# Patient Record
Sex: Male | Born: 1980 | Race: White | Hispanic: No | Marital: Single | Smoking: Current every day smoker
Health system: Southern US, Community
[De-identification: ages and names within clinical notes are randomized; demographics above are authoritative.]

## PROBLEM LIST (undated history)

## (undated) HISTORY — PX: HERNIA REPAIR: SHX51

---
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
## 1999-05-02 ENCOUNTER — Encounter: Admission: RE | Admit: 1999-05-02 | Discharge: 1999-05-02 | Payer: Self-pay | Admitting: Family Medicine

## 1999-06-03 ENCOUNTER — Ambulatory Visit (HOSPITAL_BASED_OUTPATIENT_CLINIC_OR_DEPARTMENT_OTHER): Admission: RE | Admit: 1999-06-03 | Discharge: 1999-06-03 | Payer: Self-pay | Admitting: Surgery

## 2001-12-27 ENCOUNTER — Encounter: Payer: Self-pay | Admitting: Emergency Medicine

## 2001-12-27 ENCOUNTER — Emergency Department (HOSPITAL_COMMUNITY): Admission: EM | Admit: 2001-12-27 | Discharge: 2001-12-27 | Payer: Self-pay | Admitting: Emergency Medicine

## 2002-01-03 ENCOUNTER — Ambulatory Visit (HOSPITAL_BASED_OUTPATIENT_CLINIC_OR_DEPARTMENT_OTHER): Admission: RE | Admit: 2002-01-03 | Discharge: 2002-01-03 | Payer: Self-pay | Admitting: Orthopedic Surgery

## 2004-09-10 ENCOUNTER — Emergency Department (HOSPITAL_COMMUNITY): Admission: EM | Admit: 2004-09-10 | Discharge: 2004-09-10 | Payer: Self-pay | Admitting: Emergency Medicine

## 2019-04-06 ENCOUNTER — Emergency Department (HOSPITAL_COMMUNITY): Payer: Self-pay

## 2019-04-06 ENCOUNTER — Other Ambulatory Visit: Payer: Self-pay

## 2019-04-06 ENCOUNTER — Emergency Department (HOSPITAL_COMMUNITY)
Admission: EM | Admit: 2019-04-06 | Discharge: 2019-04-06 | Disposition: A | Discharge status: Home / Self Care | Payer: Self-pay | Attending: Emergency Medicine | Admitting: Emergency Medicine

## 2019-04-06 ENCOUNTER — Encounter (HOSPITAL_COMMUNITY): Payer: Self-pay

## 2019-04-06 DIAGNOSIS — F17200 Nicotine dependence, unspecified, uncomplicated: Secondary | ICD-10-CM | POA: Insufficient documentation

## 2019-04-06 DIAGNOSIS — N419 Inflammatory disease of prostate, unspecified: Secondary | ICD-10-CM | POA: Insufficient documentation

## 2019-04-06 DIAGNOSIS — R519 Headache, unspecified: Secondary | ICD-10-CM | POA: Insufficient documentation

## 2019-04-06 DIAGNOSIS — R05 Cough: Secondary | ICD-10-CM | POA: Insufficient documentation

## 2019-04-06 DIAGNOSIS — R200 Anesthesia of skin: Secondary | ICD-10-CM | POA: Insufficient documentation

## 2019-04-06 DIAGNOSIS — R319 Hematuria, unspecified: Secondary | ICD-10-CM | POA: Insufficient documentation

## 2019-04-06 DIAGNOSIS — R0789 Other chest pain: Secondary | ICD-10-CM | POA: Insufficient documentation

## 2019-04-06 LAB — TROPONIN I (HIGH SENSITIVITY)
Troponin I (High Sensitivity): 4 ng/L (ref <18)
Troponin I (High Sensitivity): 5 ng/L (ref <18)

## 2019-04-06 LAB — CBC
HCT: 50.1 % (ref 39.0–52.0)
Hemoglobin: 16.3 g/dL (ref 13.0–17.0)
MCH: 33.6 pg (ref 26.0–34.0)
MCHC: 32.5 g/dL (ref 30.0–36.0)
MCV: 103.3 fL — ABNORMAL HIGH (ref 80.0–100.0)
Platelets: 223 10*3/uL (ref 150–400)
RBC: 4.85 MIL/uL (ref 4.22–5.81)
RDW: 13.3 % (ref 11.5–15.5)
WBC: 8.9 10*3/uL (ref 4.0–10.5)
nRBC: 0 % (ref 0.0–0.2)

## 2019-04-06 LAB — BASIC METABOLIC PANEL
Anion gap: 13 (ref 5–15)
BUN: 9 mg/dL (ref 6–20)
CO2: 23 mmol/L (ref 22–32)
Calcium: 9.3 mg/dL (ref 8.9–10.3)
Chloride: 98 mmol/L (ref 98–111)
Creatinine, Ser: 1.31 mg/dL — ABNORMAL HIGH (ref 0.61–1.24)
GFR calc Af Amer: >60 mL/min (ref >60)
GFR calc non Af Amer: >60 mL/min (ref >60)
Glucose, Bld: 106 mg/dL — ABNORMAL HIGH (ref 70–99)
Potassium: 4.2 mmol/L (ref 3.5–5.1)
Sodium: 134 mmol/L — ABNORMAL LOW (ref 135–145)

## 2019-04-06 LAB — URINALYSIS, ROUTINE W REFLEX MICROSCOPIC
Bilirubin Urine: NEGATIVE
Glucose, UA: NEGATIVE mg/dL
Ketones, ur: 20 mg/dL — AB
Nitrite: NEGATIVE
Protein, ur: 100 mg/dL — AB
RBC / HPF: >50 RBC/hpf — ABNORMAL HIGH (ref 0–5)
Specific Gravity, Urine: 1.025 (ref 1.005–1.030)
WBC, UA: >50 WBC/hpf — ABNORMAL HIGH (ref 0–5)
pH: 5 (ref 5.0–8.0)

## 2019-04-06 MED ORDER — DOXYCYCLINE HYCLATE 100 MG PO TABS
100.0000 mg | ORAL_TABLET | Freq: Once | ORAL | Status: AC
  Administered 2019-04-06: 100 mg via ORAL
  Filled 2019-04-06: qty 1

## 2019-04-06 MED ORDER — FLUCONAZOLE 150 MG PO TABS
150.0000 mg | ORAL_TABLET | Freq: Once | ORAL | Status: AC
  Administered 2019-04-06: 19:00:00 150 mg via ORAL
  Filled 2019-04-06: qty 1

## 2019-04-06 MED ORDER — MORPHINE SULFATE (PF) 4 MG/ML IV SOLN
4.0000 mg | Freq: Once | INTRAVENOUS | Status: AC
  Administered 2019-04-06: 4 mg via INTRAVENOUS
  Filled 2019-04-06: qty 1

## 2019-04-06 MED ORDER — SODIUM CHLORIDE 0.9% FLUSH: 3.0000 mL | Freq: Once | INTRAVENOUS | Status: DC

## 2019-04-06 MED ORDER — IBUPROFEN 600 MG PO TABS: 600.0000 mg | ORAL_TABLET | Freq: Four times a day (QID) | ORAL | 0 refills | Status: AC | PRN

## 2019-04-06 MED ORDER — FLUCONAZOLE 150 MG PO TABS: 150.0000 mg | ORAL_TABLET | Freq: Once | ORAL | 0 refills | Status: AC

## 2019-04-06 MED ORDER — MORPHINE SULFATE (PF) 4 MG/ML IV SOLN
4.0000 mg | Freq: Once | INTRAVENOUS | Status: DC
  Filled 2019-04-06: qty 1

## 2019-04-06 MED ORDER — SODIUM CHLORIDE 0.9 % IV BOLUS
1000.0000 mL | Freq: Once | INTRAVENOUS | Status: AC
  Administered 2019-04-06: 1000 mL via INTRAVENOUS

## 2019-04-06 MED ORDER — KETOROLAC TROMETHAMINE 30 MG/ML IJ SOLN
15.0000 mg | Freq: Once | INTRAMUSCULAR | Status: AC
  Administered 2019-04-06: 15 mg via INTRAVENOUS
  Filled 2019-04-06: qty 1

## 2019-04-06 MED ORDER — CEFTRIAXONE SODIUM 250 MG IJ SOLR
250.0000 mg | Freq: Once | INTRAMUSCULAR | Status: AC
  Administered 2019-04-06: 250 mg via INTRAMUSCULAR
  Filled 2019-04-06: qty 250

## 2019-04-06 MED ORDER — DOXYCYCLINE HYCLATE 100 MG PO CAPS: 100.0000 mg | ORAL_CAPSULE | Freq: Two times a day (BID) | ORAL | 0 refills | Status: AC

## 2019-04-06 MED ORDER — HYDROCODONE-ACETAMINOPHEN 5-325 MG PO TABS: 1.0000 | ORAL_TABLET | Freq: Four times a day (QID) | ORAL | 0 refills | Status: AC | PRN

## 2019-04-06 NOTE — ED Notes (Signed)
Patient verbalizes understanding of discharge instructions. Opportunity for questioning and answers were provided. pt discharged from ED with family.   

## 2019-04-06 NOTE — ED Provider Notes (Signed)
[facility] EMERGENCY DEPARTMENT Provider Note   CSN: [id number] Arrival date & time: 04/06/19  1308     History   Chief Complaint Chief Complaint  Patient presents with   Urinary Tract Infection   Chest Pain    HPI Kevin Maddox is a 38 y.o. male with history of hernia repair who presents with a 10-day history of urinary burning, urgency, hematuria, and swelling noticed prostate.  It has been hurting him to sit.  He reports he passed a large clot few days ago.  Patient denies any history of kidney stones, however he states he might have seen a kidney stone when he passed a clot. Patient denies any swelling or pain in scrotum and that all his pain is behind his scrotum and testicles. He also reports of a few day history of left-sided chest pain and arm pain.  He reports he has had numbness in his left fourth and fifth digits for the past several months.  He also reports headache in his eyes recently.  Seems to get worse when he moves his neck.  Patient has tried Tylenol at home without relief.  Patient reports a new sexual partner 3 weeks ago.     HPI  History reviewed. No pertinent past medical history.  There are no active problems to display for this patient.   Past Surgical History:  Procedure Laterality Date   HERNIA REPAIR          Home Medications    Prior to Admission medications   Medication Sig Start Date End Date Taking? Authorizing Provider  doxycycline (VIBRAMYCIN) 100 MG capsule Take 1 capsule (100 mg total) by mouth 2 (two) times daily for 14 days. 04/06/19 04/20/19  Vada Swift, Waylan Boga, PA-C  fluconazole (DIFLUCAN) 150 MG tablet Take 1 tablet (150 mg total) by mouth once for 1 dose. 04/09/19 04/09/19  Emi Holes, PA-C  HYDROcodone-acetaminophen (NORCO/VICODIN) 5-325 MG tablet Take 1-2 tablets by mouth every 6 (six) hours as needed for severe pain. 04/06/19   Dashawn Bartnick, Waylan Boga, PA-C  ibuprofen (ADVIL) 600 MG tablet Take 1 tablet  (600 mg total) by mouth every 6 (six) hours as needed. 04/06/19   Emi Holes, PA-C    Family History History reviewed. No pertinent family history.  Social History Social History   Tobacco Use   Smoking status: Current Every Day Smoker   Smokeless tobacco: Never Used  Substance Use Topics   Alcohol use: Yes    Comment: social    Drug use: Yes    Types: Marijuana     Allergies   Patient has no known allergies.   Review of Systems Review of Systems  Constitutional: Positive for chills. Negative for fever.  HENT: Negative for facial swelling and sore throat.   Respiratory: Positive for cough (chronic, smoker's, unchanged). Negative for shortness of breath.   Cardiovascular: Positive for chest pain.  Gastrointestinal: Negative for abdominal pain, nausea and vomiting.  Genitourinary: Positive for discharge (pt reports a little bit the other night), dysuria, hematuria and urgency. Negative for penile pain, penile swelling and scrotal swelling.  Musculoskeletal: Negative for back pain.  Skin: Negative for rash and wound.  Neurological: Positive for headaches.  Psychiatric/Behavioral: The patient is not nervous/anxious.      Physical Exam Updated Vital Signs BP 123/72 (BP Location: Left Arm)    Pulse 77    Temp 98.6 F (37 C) (Oral)    Resp 18    Ht  (  1.88 m)    Wt 77.1 kg    SpO2 100%    BMI 21.83 kg/m   Physical Exam Vitals signs and nursing note reviewed.  Constitutional:      General: He is not in acute distress.    Appearance: He is well-developed. He is not diaphoretic.  HENT:     Head: Normocephalic and atraumatic.     Mouth/Throat:     Pharynx: No oropharyngeal exudate.  Eyes:     General: No scleral icterus.       Right eye: No discharge.        Left eye: No discharge.     Conjunctiva/sclera: Conjunctivae normal.     Pupils: Pupils are equal, round, and reactive to light.  Neck:     Musculoskeletal: Normal range of motion and neck supple.       Thyroid: No thyromegaly.  Cardiovascular:     Rate and Rhythm: Normal rate and regular rhythm.     Heart sounds: Normal heart sounds. No murmur. No friction rub. No gallop.   Pulmonary:     Effort: Pulmonary effort is normal. No respiratory distress.     Breath sounds: Normal breath sounds. No stridor. No wheezing or rales.  Abdominal:     General: Bowel sounds are normal. There is no distension.     Palpations: Abdomen is soft.     Tenderness: There is no abdominal tenderness. There is no guarding or rebound.  Genitourinary:    Penis: Discharge (yellow) present.      Scrotum/Testes: Normal.     Epididymis:     Right: Normal.     Left: Normal.     Prostate: Enlarged and tender.     Rectum: No external hemorrhoid.     Comments: Perineal tenderness Lymphadenopathy:     Cervical: No cervical adenopathy.  Skin:    General: Skin is warm and dry.     Coloration: Skin is not pale.     Findings: No rash.  Neurological:     Mental Status: He is alert.     Coordination: Coordination normal.     Comments: CN 3-12 intact; normal sensation throughout, except decreased on the left fourth and fifth fingers; 5/5 strength in all 4 extremities; equal bilateral grip strength       ED Treatments / Results  Labs (all labs ordered are listed, but only abnormal results are displayed) Labs Reviewed  BASIC METABOLIC PANEL - Abnormal; Notable for the following components:      Result Value   Sodium 134 (*)    Glucose, Bld 106 (*)    Creatinine, Ser 1.31 (*)    All other components within normal limits  CBC - Abnormal; Notable for the following components:   MCV 103.3 (*)    All other components within normal limits  URINALYSIS, ROUTINE W REFLEX MICROSCOPIC - Abnormal; Notable for the following components:   Color, Urine AMBER (*)    APPearance CLOUDY (*)    Hgb urine dipstick LARGE (*)    Ketones, ur 20 (*)    Protein, ur 100 (*)    Leukocytes,Ua LARGE (*)    RBC / HPF >50 (*)     WBC, UA >50 (*)    Bacteria, UA FEW (*)    Non Squamous Epithelial 0-5 (*)    All other components within normal limits  URINE CULTURE  GC/CHLAMYDIA PROBE AMP ([place]) NOT [facility]  TROPONIN I (HIGH SENSITIVITY)  TROPONIN I (HIGH SENSITIVITY)  EKG EKG Interpretation  Date/Time:  Thursday April 06 2019 13:21:47 EDT Ventricular Rate:  103 PR Interval:  126 QRS Duration: 80 QT Interval:  338 QTC Calculation: 442 R Axis:   91 Text Interpretation:  Sinus tachycardia Right atrial enlargement Rightward axis Pulmonary disease pattern Abnormal ECG Confirmed by Davonna Belling [phone]) on 04/06/2019 3:24:09 PM   Radiology Dg Chest 2 View  Result Date: 04/06/2019 CLINICAL DATA:  Chest pain, left arm pain, shortness of breath EXAM: CHEST - 2 VIEW COMPARISON:  09/10/2004 FINDINGS: Mild hyperinflation and central bronchitic change. No definite focal pneumonia, collapse or consolidation. Negative for edema, effusion or pneumothorax. Trachea midline. Normal heart size and vascularity. Artifact overlies the mid lungs bilaterally. IMPRESSION: Hyperinflation and central bronchitic change, nonspecific. No focal pneumonia. Electronically Signed   By: Jerilynn Mages.  Shick M.D.   On: 04/06/2019 13:56   Ct Renal Stone Study  Result Date: 04/06/2019 CLINICAL DATA:  Flank pain, hematuria and dysuria. Suspected stone disease. EXAM: CT ABDOMEN AND PELVIS WITHOUT CONTRAST TECHNIQUE: Multidetector CT imaging of the abdomen and pelvis was performed following the standard protocol without IV contrast. COMPARISON:  None. FINDINGS: Lower chest: No acute findings. Hepatobiliary: No mass visualized on this unenhanced exam. Gallbladder is unremarkable. No evidence of biliary ductal dilatation. Pancreas: No mass or inflammatory process visualized on this unenhanced exam. Spleen:  Within normal limits in size. Adrenals/Urinary tract: No evidence of urolithiasis or hydronephrosis. Unremarkable unopacified urinary bladder.  Stomach/Bowel: No evidence of obstruction, inflammatory process, or abnormal fluid collections. Diverticulosis is seen mainly involving the sigmoid colon, however there is no evidence of diverticulitis. Surgical mesh noted in the lower anterior abdominal wall soft tissues. No evidence of hernia. Vascular/Lymphatic: No pathologically enlarged lymph nodes identified. No evidence of abdominal aortic aneurysm. Reproductive:  No mass or other significant abnormality. Other:  None. Musculoskeletal:  No suspicious bone lesions identified. IMPRESSION: No evidence of urolithiasis, hydronephrosis, or other acute findings. Colonic diverticulosis. No radiographic evidence of diverticulitis. Electronically Signed   By: Marlaine Hind M.D.   On: 04/06/2019 19:00    Procedures Procedures (including critical care time)  Medications Ordered in ED Medications  sodium chloride flush (NS) 0.9 % injection 3 mL (3 mLs Intravenous Not Given 04/06/19 1615)  morphine 4 MG/ML injection 4 mg (4 mg Intravenous Not Given 04/06/19 1927)  sodium chloride 0.9 % bolus 1,000 mL (0 mLs Intravenous Stopped 04/06/19 1829)  morphine 4 MG/ML injection 4 mg (4 mg Intravenous Given 04/06/19 1703)  fluconazole (DIFLUCAN) tablet 150 mg (150 mg Oral Given 04/06/19 1917)  cefTRIAXone (ROCEPHIN) injection 250 mg (250 mg Intramuscular Given 04/06/19 1918)  doxycycline (VIBRA-TABS) tablet 100 mg (100 mg Oral Given 04/06/19 1917)  ketorolac (TORADOL) 30 MG/ML injection 15 mg (15 mg Intravenous Given 04/06/19 1925)     Initial Impression / Assessment and Plan / ED Course  I have reviewed the triage vital signs and the nursing notes.  Pertinent labs & imaging results that were available during my care of the patient were reviewed by me and considered in my medical decision making (see chart for details).        Patient presenting with dysuria and hematuria for 10 days. He has had associated tenderness in his perineal area and prostate.  Suspect prostatitis. UA shows large hematuria and leukocytes, greater than 50 RBCs WBCs, WBC clumps and budding yeast. Creatinine 1.31. Patient given IV fluids. Patient does have a penile discharge and a new sexual partner. Favor STD as cause. GC/chlamydia sent and  pending. Patient treated with Rocephin and doxycycline as well as Diflucan for yeast. Follow-up with urology. We also discussed follow-up with neurosurgery for probable cervical radiculopathy. 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 Suspect possible cause of his chest pain as well, probable chest wall pain. 2 troponins are negative here. Patient's arm numbness is not a new problem and patient has normal strength. Patient reports he did not come for this problem today. Patient also encouraged to follow-up with establish care with a PCP. Return precautions discussed. Patient advised he will be called with positive results of urine culture or STD. He is advised to make sexual partners aware that they will need to be treated as well. He understands and agrees with plan. Patient had a stable throughout ED course and discharged in satisfactory condition. I discussed patient case with Dr. Madilyn Hook who guided the patient's management and agrees with plan.   Final Clinical Impressions(s) / ED Diagnoses   Final diagnoses:  Prostatitis, unspecified prostatitis type    ED Discharge Orders         Ordered    fluconazole (DIFLUCAN) 150 MG tablet   Once     04/06/19 1949    doxycycline (VIBRAMYCIN) 100 MG capsule  2 times daily     04/06/19 1949    ibuprofen (ADVIL) 600 MG tablet  Every 6 hours PRN     04/06/19 1949    HYDROcodone-acetaminophen (NORCO/VICODIN) 5-325 MG tablet  Every 6 hours PRN     04/06/19 1949           Emi Holes, PA-C 04/06/19 2008    Tilden Fossa, MD 04/06/19 2234

## 2019-04-06 NOTE — Discharge Instructions (Signed)
Take doxycycline as prescribed until completed.  Take Diflucan for 3 days.  Take ibuprofen every 6 hours as needed for your pain.  For severe pain, you can take 1-2 Vicodin every 6 hours.  Do not drive or operate machinery while taking this medication.  Make sure to stay well-hydrated.  For further evaluation and treatment of your finger numbness, please follow-up with neurosurgery.  You can establish care with a primary care provider at the community [facility] and wellness center or patient care clinic.  Please return the emergency department if you develop any new or worsening symptoms.  You have been treated for gonorrhea and chlamydia, as well as yeast, today. You will be called in 3 days if any of your tests return positive. In that case, please make all of your sexual partners aware that they will need to be treated as well. Abstain from intercourse for one week until you have both been treated.  You will also be called if your urine culture grows a different bacteria than the antibiotics we gave you today will treat.  Do not drink alcohol, drive, operate machinery or participate in any other potentially dangerous activities while taking opiate pain medication as it may make you sleepy. Do not take this medication with any other sedating medications, either prescription or over-the-counter. If you were prescribed Percocet or Vicodin, do not take these with acetaminophen (Tylenol) as it is already contained within these medications and overdose of Tylenol is dangerous.   This medication is an opiate (or narcotic) pain medication and can be habit forming.  Use it as little as possible to achieve adequate pain control.  Do not use or use it with extreme caution if you have a history of opiate abuse or dependence. This medication is intended for your use only - do not give any to anyone else and keep it in a secure place where nobody else, especially children, have access to it. It will also cause or worsen  constipation, so you may want to consider taking an over-the-counter stool softener while you are taking this medication.

## 2019-04-06 NOTE — ED Triage Notes (Signed)
Pt presents with multiple complaints  burning w/urination and urgency x10days, states he feels he passed a kidney stone Monday night since then he's had small amount of blood in his urine, now with prostate swelling difficult to walk or sit.  Headache, "minor"  Left side chest and Left arm pain x4 days

## 2019-04-06 NOTE — ED Notes (Signed)
Requested urine from patient. 

## 2019-04-07 LAB — URINE CULTURE: Culture: <10000 — AB

## 2019-04-10 LAB — GC/CHLAMYDIA PROBE AMP (~~LOC~~) NOT AT ARMC
Chlamydia: NEGATIVE
Neisseria Gonorrhea: POSITIVE — AB

## 2020-11-29 IMAGING — CT CT RENAL STONE PROTOCOL
2 of 4 series · 16 of 46 positions shown, 18 images · non-contrast
Comparison: None.

CLINICAL DATA: Flank pain, hematuria and dysuria. Suspected stone
disease.

EXAM:
CT ABDOMEN AND PELVIS WITHOUT CONTRAST
TECHNIQUE: Multidetector CT imaging of the abdomen and pelvis was performed
following the standard protocol without IV contrast.

[Series 3: stone study 5.0 i30f 2 · axial · 0.80mm/px · z∈[+644,+1084]mm · 13 of 98 slices shown, 15 images]
[im 5/98  soft-tissue]
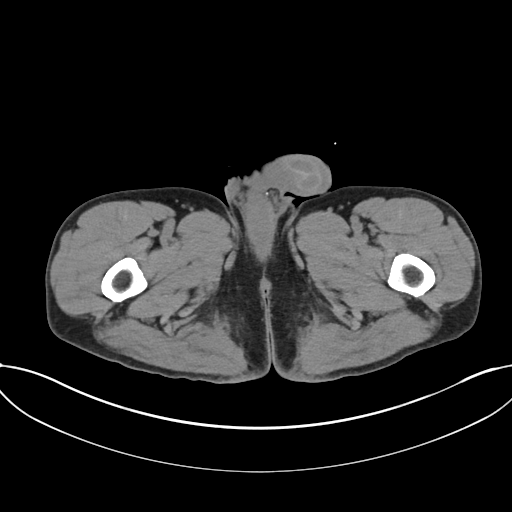
[im 5/98  bone]
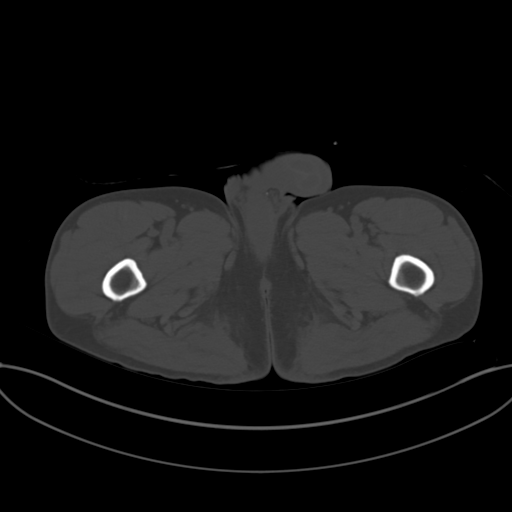
[im 13/98  soft-tissue]
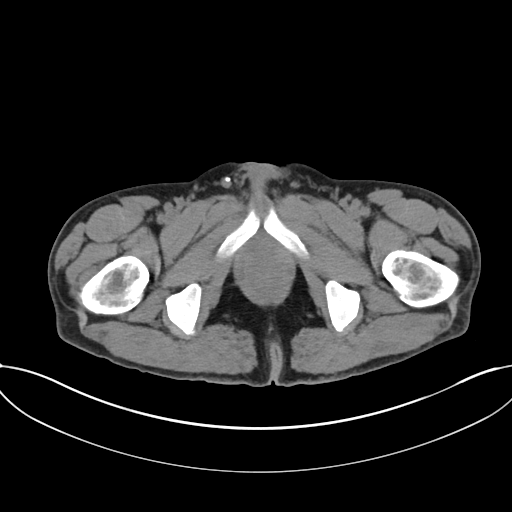
[im 21/98  soft-tissue]
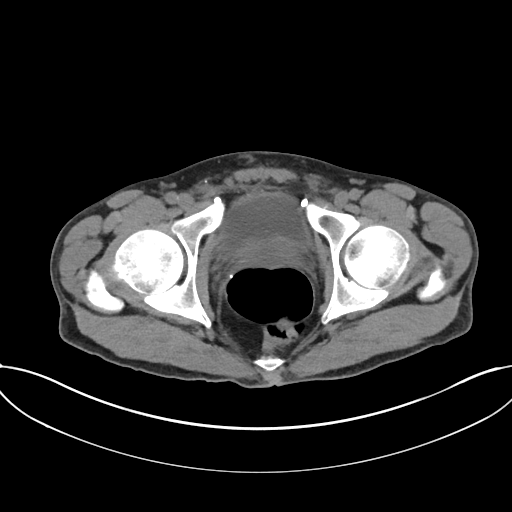
[im 29/98  soft-tissue]
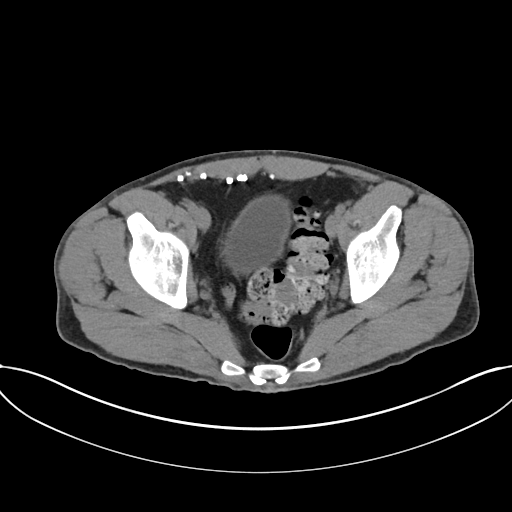
[im 33/98  soft-tissue]
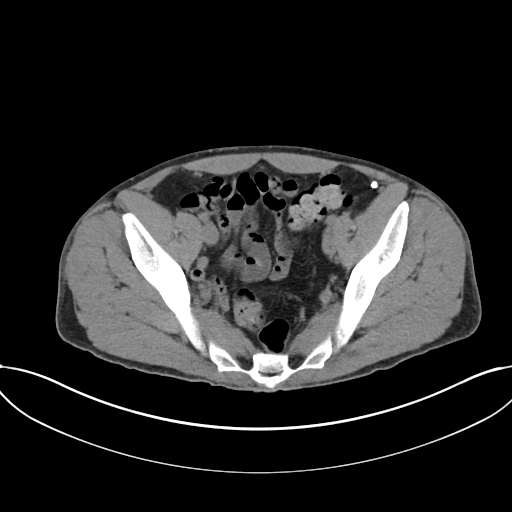
[im 41/98  soft-tissue]
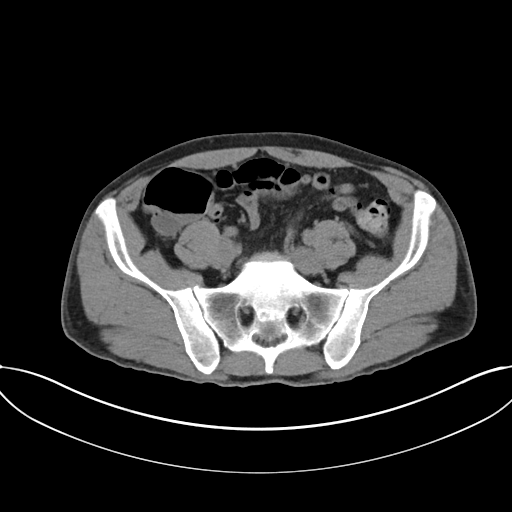
[im 49/98  soft-tissue]
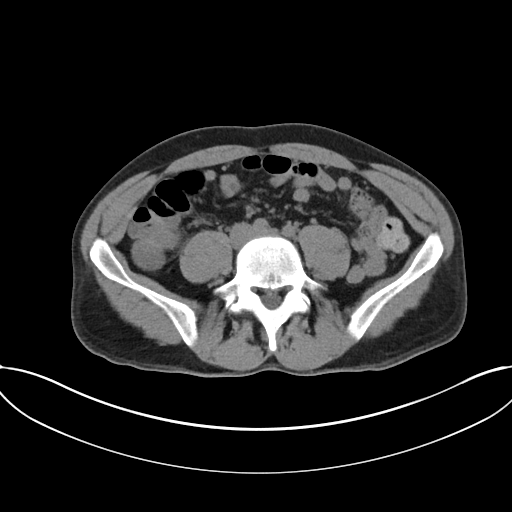
[im 57/98  soft-tissue]
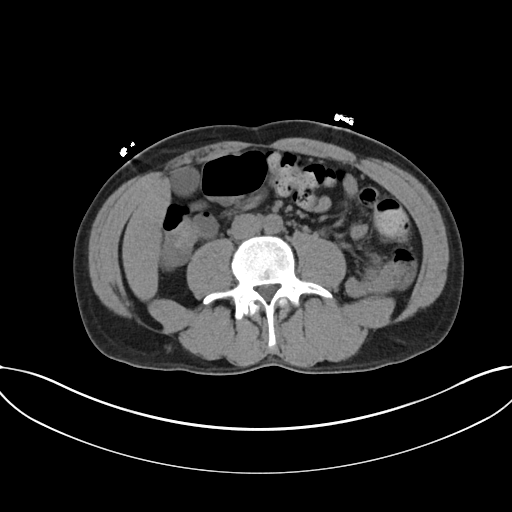
[im 65/98  soft-tissue]
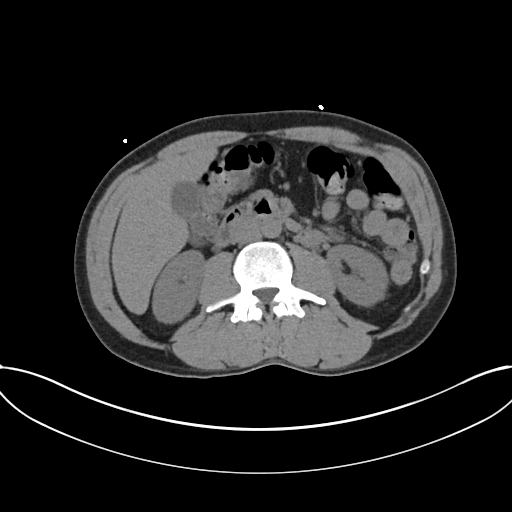
[im 65/98  bone]
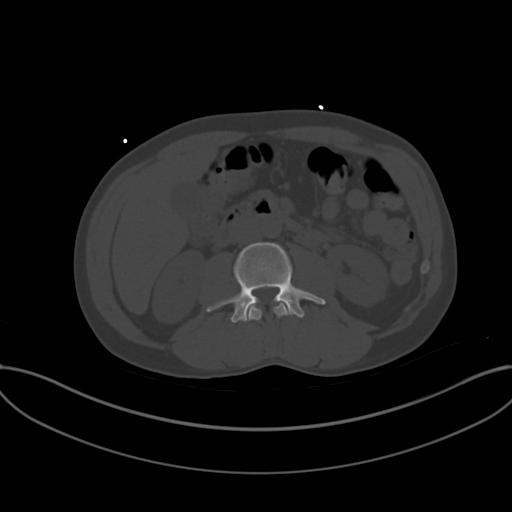
[im 69/98  soft-tissue]
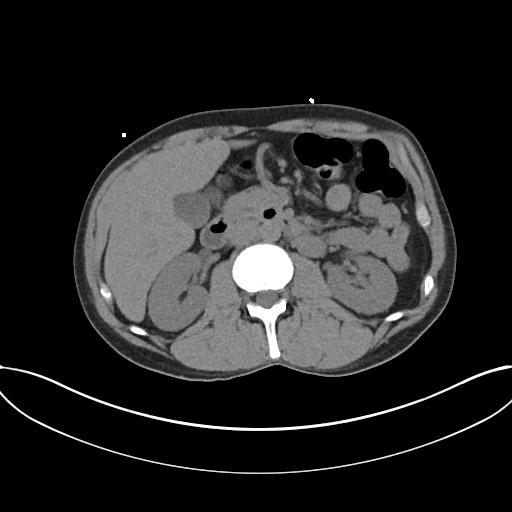
[im 77/98  soft-tissue]
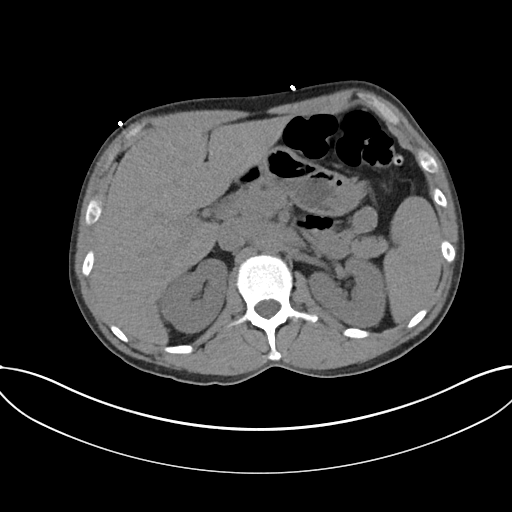
[im 85/98  soft-tissue]
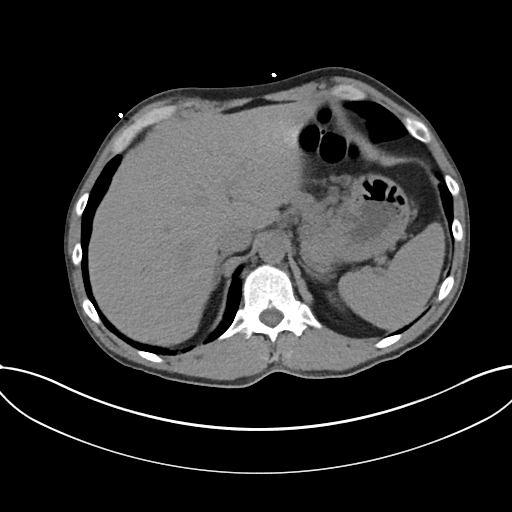
[im 93/98  soft-tissue]
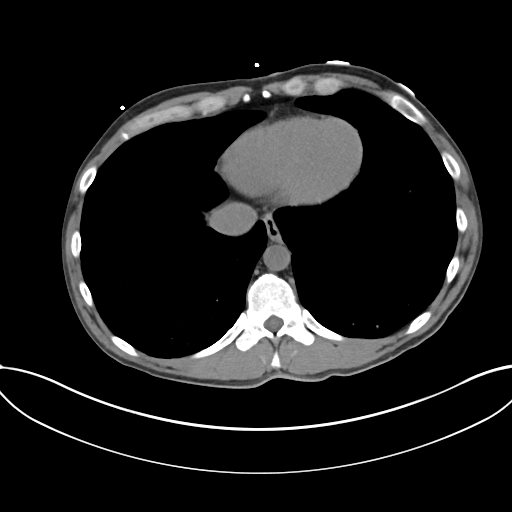

[Series 6: coronal soft tissue · coronal · 0.71mm/px · 3 of 100 slices shown]
[im 34/100  soft-tissue]
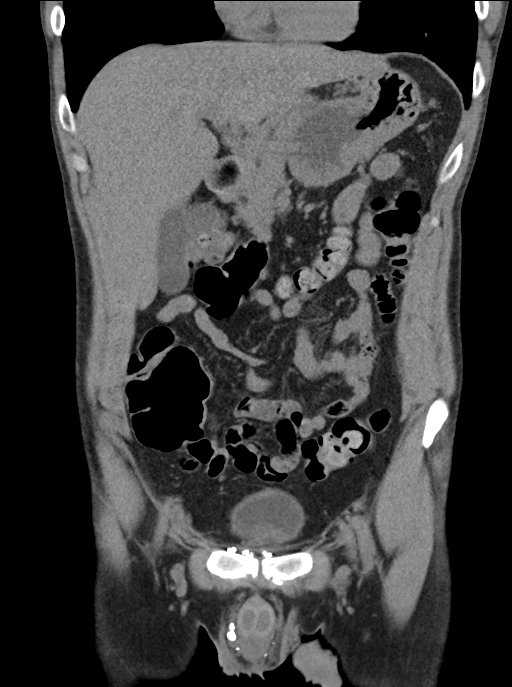
[im 45/100  soft-tissue]
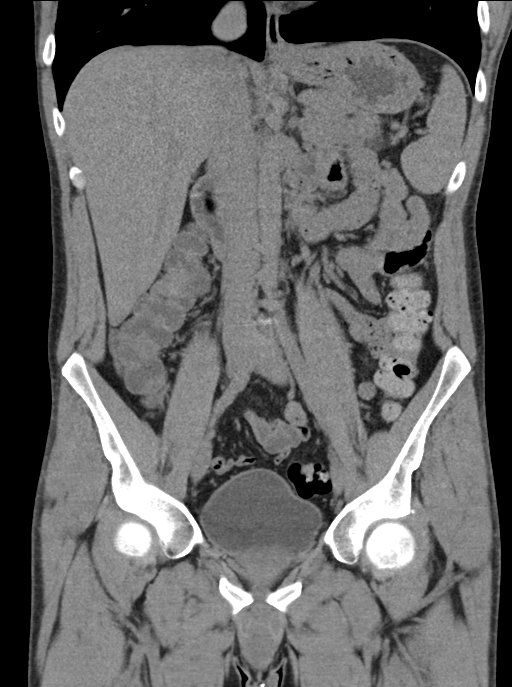
[im 56/100  soft-tissue]
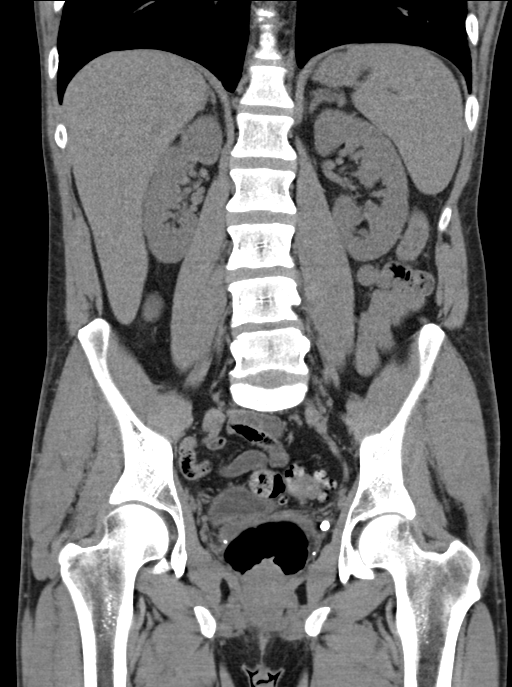

[16 of 46 positions shown; findings below may reference images not displayed]

FINDINGS: Lower chest: No acute findings.

Hepatobiliary: No mass visualized on this unenhanced exam.
Gallbladder is unremarkable. No evidence of biliary ductal
dilatation.

Pancreas: No mass or inflammatory process visualized on this
unenhanced exam.

Spleen:  Within normal limits in size.

Adrenals/Urinary tract: No evidence of urolithiasis or
hydronephrosis. Unremarkable unopacified urinary bladder.

Stomach/Bowel: No evidence of obstruction, inflammatory process, or
abnormal fluid collections. Diverticulosis is seen mainly involving
the sigmoid colon, however there is no evidence of diverticulitis.
Surgical mesh noted in the lower anterior abdominal wall soft
tissues. No evidence of hernia.

Vascular/Lymphatic: No pathologically enlarged lymph nodes
identified. No evidence of abdominal aortic aneurysm.

Reproductive:  No mass or other significant abnormality.

Other:  None.

Musculoskeletal:  No suspicious bone lesions identified.
IMPRESSION: No evidence of urolithiasis, hydronephrosis, or other acute
findings.

Colonic diverticulosis. No radiographic evidence of diverticulitis.

## 2024-04-22 DEATH — deceased
